# Patient Record
Sex: Female | Born: 1955 | Race: White | Hispanic: No | Marital: Married
Health system: Midwestern US, Community
[De-identification: ages and names within clinical notes are randomized; demographics above are authoritative.]

## PROBLEM LIST (undated history)

## (undated) DIAGNOSIS — Z13220 Encounter for screening for lipoid disorders: Secondary | ICD-10-CM

## (undated) DIAGNOSIS — R194 Change in bowel habit: Secondary | ICD-10-CM

## (undated) HISTORY — DX: Encounter for screening for lipoid disorders: Z13.220

---

## 2000-09-28 NOTE — ED Provider Notes (Signed)
[facility]                      EMERGENCY DEPARTMENT TREATMENT REPORT   NAME:  Sandra Moss, Sandra Moss   MR #:  [id number]   BILLING #: [id number]        DOS: 09/28/2000  TIME: 3:03 P   cc:   Primary Physician:  Zonia Kief, M.D.   CHIEF COMPLAINT:   Low back pain.   HISTORY OF PRESENT ILLNESS:  The patient is a 45 year old white female who   presents to the Emergency Department with her husband.  The patient states   that this past Wednesday, 09/26/2000, she was lifting a heavy PA component   while at work and twisted and injured her low back.  The patient states   that she immediately had some pain in the right low back.  The patient   stated that the pain had worsened that evening and by the following   morning, she had increased pain with pain radiating down the lateral   posterior aspect of the right leg just past the right knee on the lateral   calf.  The patient denies any bowel or bladder complaints and denies any   paresthesia or weakness of the lower extremities.  The patient was seen   yesterday at [facility] in the [place] where the patient   lives.  She was diagnosed with "herniated disc."  She was prescribed   Vicodin ES and prednisone.  The patient was concerned about this and stated   she was concerned because they did not do any diagnostic tests and came to   this Emergency Department for additional evaluation and essentially a   second opinion.  The patient denied any new symptoms other than worsening   of her symptoms.   REVIEW OF SYSTEMS:   CONSTITUTIONAL:  No fever, chills, weight loss.   RESPIRATORY:  No cough, shortness of breath, or wheezing.   CARDIOVASCULAR:  No chest pain, chest pressure, or palpitations.   GASTROINTESTINAL:  No vomiting, diarrhea, or abdominal pain. Denies any   stool incontinence.   GENITOURINARY:  No dysuria, frequency, or urgency.  Denies any urinary   incontinence. MUSCULOSKELETAL:  Right lower back pain radiating to right    leg as above.   NEUROLOGICAL:   Denies any focal neurological complaints.  Denies any   paresthesia or weakness of the extremities.   Denies complaints in any other system.   PAST MEDICAL HISTORY:   No chronic medical ailments. Left main was   09/12/2000.   MEDICATIONS:  Vicodin and prednisone.   ALLERGIES:    None.   SOCIAL HISTORY:  The patient denies any tobacco, ETOH, or illicit drug use.   The patient is married.   PHYSICAL EXAMINATION:   GENERAL:   The patient is a 45 year old white female, well-developed,   well-nourished, conscious, alert and oriented.  No respiratory distress.   VITAL SIGNS:  Blood pressure 103/74, pulse 70, respirations 20, temperature   95.  The patient's pain is a 10/10.   SKIN:  Warm and dry.  No lesions.   HEENT:  Normocephalic, atraumatic, PERRL, EOMI.  Oropharynx clear. Mucous   membranes moist.  Sclerae is anicteric.   NECK:  Supple, full range of motion, nontender.   LUNGS:  Clear and equal breath sounds.  No wheezes or rhonchi.   CARDIOVASCULAR:  Regular rate and rhythm without significant murmurs, rubs,   or gallops.  ABDOMEN:  Soft, nontender, normal bowel sounds.  No organomegaly, mass,   rebound, or guarding.   BACK:  The patient is diffusely tender to palpation over the lower   paralumbar sacral spinal musculature.  Nontender to palpation and no bony   tenderness over the thoracic or lumbosacral spine itself.  The patient has   negative straight leg raise bilateral lower extremities.  The patient   however does have increased discomfort in her low back with movement of the   lower extremities.   EXTREMITIES:  Full range of motion, normal strength of all extremities.   Deep tendon reflexes are equal lower extremities bilaterally.   COURSE IN THE EMERGENCY DEPARTMENT:  The patient was seen and examined by   myself and Dr. Damien Fusi.  The patient was given Demerol 75 mg and   Vistaril 50 mg IM for pain while in the Emergency Department. The patient's    condition remained stable throughout stay in the Emergency Department and   she felt improved after receiving the medications.   FINAL DIAGNOSIS:   1.  Acute low back strain.   DISPOSITION:  The patient is to be discharged home in stable condition with   low back strain Aftercare instruction sheet.  The patient was advised to   use ice to the low back, continue her prescribed medications, follow up   with Dr. Sabino Donovan who is on call for Orthopedics.  Also, the patient was   given prescription for Flexeril 10 mg and Lodine 300 mg.  The patient was   advised to return to work on 10/03/2000 with limited duty to involve no   bending of the back, no bending, pushing, pulling, or carrying any weight   until cleared by an orthopedist.   Electronically Signed By:   Damien Fusi, M.D. 10/16/2000 02:05   ____________________________   Damien Fusi, M.D.   zga/zga  D:  09/28/2000, 10/11/2000  T:  10/01/2000  7:41 A   [id number]   [name], PA

## 2015-10-22 ENCOUNTER — Inpatient Hospital Stay: Payer: PRIVATE HEALTH INSURANCE

## 2015-10-22 MED ORDER — PROPOFOL 10 MG/ML IV EMUL
10 mg/mL | INTRAVENOUS | Status: DC | PRN
Start: 2015-10-22 — End: 2015-10-22
  Administered 2015-10-22 (×4): via INTRAVENOUS

## 2015-10-22 MED ORDER — LIDOCAINE (PF) 20 MG/ML (2 %) IJ SOLN
20 mg/mL (2 %) | INTRAMUSCULAR | Status: DC | PRN
Start: 2015-10-22 — End: 2015-10-22
  Administered 2015-10-22: 15:00:00 via INTRAVENOUS

## 2015-10-22 MED ORDER — PROPOFOL 10 MG/ML IV EMUL
10 mg/mL | INTRAVENOUS | Status: AC
Start: 2015-10-22 — End: ?

## 2015-10-22 MED ORDER — LACTATED RINGERS IV
INTRAVENOUS | Status: DC
Start: 2015-10-22 — End: 2015-10-22
  Administered 2015-10-22: 14:00:00 via INTRAVENOUS

## 2015-10-22 MED ORDER — INSULIN LISPRO 100 UNIT/ML INJECTION
100 unit/mL | Freq: Once | SUBCUTANEOUS | Status: DC
Start: 2015-10-22 — End: 2015-10-22

## 2015-10-22 MED FILL — PROPOFOL 10 MG/ML IV EMUL: 10 mg/mL | INTRAVENOUS | Qty: 100

## 2015-10-22 MED FILL — LACTATED RINGERS IV: INTRAVENOUS | Qty: 1000

## 2015-10-22 NOTE — Procedures (Signed)
Procedures  by Willaim Rayasivera, Babette Stum J, MD at 10/22/15 1118                Author: Willaim Rayasivera, Renate Danh J, MD  Service: Gastroenterology  Author Type: Physician       Filed: 10/22/15 1120  Date of Service: 10/22/15 1118  Status: Signed          Editor: Willaim Rayasivera, Arnie Maiolo J, MD (Physician)            Procedures        1. COLONOSCOPY [[id number] (Custom)]                                         Colonoscopy Report          Patient: Sandra Moss  MRN: [id number]   SSN: [id number]          Date of Birth: 1956-03-05   Age: 60 y.o.   Sex: female         Date of Procedure: 10/22/2015      IMPRESSION:   1. Transverse colon polyp, 5 mm, status post forceps removed   2. Normal terminal ileum   3. Rectal polyp, 3 mm, status post forceps removed   4. Internal hemorrhoids      RECOMMENDATIONS:   1.  Resume regular diet, Recommend high fiber.   2.  Will contact with polyp results in 2 weeks. These results will determine timing to next screening. Patient will be instructed to contact our office if they have not received the results by three weeks.      Indication:  Screening colonoscopy, LUQ abdominal pain   Procedure Performed: Colonoscopy polypectomy (cold biopsy)   Endoscopist: Willaim Rayasene J Mouhamad Teed, MD   Assistant: Endoscopy Technician-1: Hoover Brownsrystal A Joyner   Endoscopy RN-1: Malva LimesPamela A Bennett, RN   ASA: ASA 2 - Patient with mild systemic disease with no functional limitations   Mallampati Score: I (soft palate, uvula, fauces, tonsillar pillars visible)   Anesthesia: MAC anesthesia Propofol   Endoscope:          [x]   CF H 190AL    []   PCF H190AL    []   GIF H 190       Extent of Examination:cecum,  which was identified by the ileocecal valve and appendiceal orifice, terminal ileum   Quality of Preparation:            [x]   Excellent    []   Very Good    []  Fair but adequate    []  Fair, inadequate    []   Poor          Technique: The procedure was discussed with the patient including risks, benefits, alternatives including risks of IV sedation, bleeding,  perforation and missed polyp.  A safety timeout was performed.   The patient was given incremental doses of intravenous sedation to achieve moderate sedation.  The patients vital signs were monitored at all times including heart rate, rhythm, blood pressure and oxygen saturation. The patient was placed in left lateral  position.    When adequate sedation was achieved a perianal inspection and a digital rectal exam were performed. Video colonoscope was introduced into the rectum and advanced under direct vision up to the cecum,  which was identified by the ileocecal valve and appendiceal orifice. The cecum was identified by IC valve, appendiceal orifice and crows foot. With adequate insufflation and  maneuvering of the withdrawing  scope, the colonic mucosa was visualized carefully. Retroflexion was performed in the rectum and the distal rectum visualized.  The patient tolerated the procedure very well and was transferred to recovery area.       Findings:   1. Transverse colon polyp, 5 mm, status post forceps removed   2. Normal terminal ileum   3. Rectal polyp, 3 mm, status post forceps removed   4. Internal hemorrhoids         UJW:[id number]   Specimen:            ID  Type  Source  Tests  Collected by  Time  Destination     1 : Bx polyp  Preservative  Colon, Transverse    Willaim Rayas, MD  10/22/2015 1059  Pathology     2 : BX polyp  Preservative  Rectum    Willaim Rayas, MD  10/22/2015 1106  Pathology           Willaim Rayas, MD   October 22, 2015   11:19 AM

## 2015-10-22 NOTE — Anesthesia Pre-Procedure Evaluation (Signed)
Anesthetic History   No history of anesthetic complications            Review of Systems / Medical History  Patient summary reviewed and pertinent labs reviewed    Pulmonary  Within defined limits                 Neuro/Psych   Within defined limits           Cardiovascular  Within defined limits                Exercise tolerance: >4 METS     GI/Hepatic/Renal  Within defined limits              Endo/Other  Within defined limits           Other Findings   Comments:   Risk Factors for Postoperative nausea/vomiting:       History of postoperative nausea/vomiting?  NO       Female?  YES       Motion sickness?  NO       Intended opioid administration for postoperative analgesia?  NO      Smoking Abstinence  Current Smoker?   NO  Elective Surgery? YES  Seen preoperatively by anesthesiologist or proxy prior to day of surgery?  YES  Pt abstained from smoking 24 hours prior to anesthesia? N/A           Physical Exam    Airway  Mallampati: II  TM Distance: 4 - 6 cm  Neck ROM: normal range of motion   Mouth opening: Normal     Cardiovascular    Rhythm: regular  Rate: normal         Dental  No notable dental hx       Pulmonary  Breath sounds clear to auscultation               Abdominal  GI exam deferred       Other Findings            Anesthetic Plan    ASA: 2  Anesthesia type: MAC            Anesthetic plan and risks discussed with: Patient

## 2015-10-22 NOTE — Procedures (Signed)
Colonoscopy Report    Patient: Sandra Moss MRN: [id number]  SSN: [id number]    Date of Birth: [birth year]  Age: 60 y.o.  Sex: female      Date of Procedure: 10/22/2015    IMPRESSION:  1. Transverse colon polyp, 5 mm, status post forceps removed  2. Normal terminal ileum  3. Rectal polyp, 3 mm, status post forceps removed  4. Internal hemorrhoids    RECOMMENDATIONS:  1. Resume regular diet, Recommend high fiber.  2. Will contact with polyp results in 2 weeks. These results will determine timing to next screening. Patient will be instructed to contact our office if they have not received the results by three weeks.    Indication:  Screening colonoscopy, LUQ abdominal pain  Procedure Performed: Colonoscopy polypectomy (cold biopsy)  Endoscopist: Willaim Rayasene J Eldred Sooy, MD  Assistant: Endoscopy Technician-1: Hoover Brownsrystal A Joyner  Endoscopy RN-1: Malva LimesPamela A Bennett, RN  ASA: ASA 2 - Patient with mild systemic disease with no functional limitations  Mallampati Score: I (soft palate, uvula, fauces, tonsillar pillars visible)  Anesthesia: MAC anesthesia Propofol  Endoscope:       CF H 190AL     PCF H190AL     GIF H 190    Extent of Examination:cecum, which was identified by the ileocecal valve and appendiceal orifice, terminal ileum  Quality of Preparation:       Excellent     Very Good    Fair but adequate    Fair, inadequate     Poor      Technique: The procedure was discussed with the patient including risks, benefits, alternatives including risks of IV sedation, bleeding, perforation and missed polyp.  A safety timeout was performed.  The patient was given incremental doses of intravenous sedation to achieve moderate sedation.  The patients vital signs were monitored at all times including heart rate, rhythm, blood pressure and oxygen saturation. The patient was placed in left lateral position.   When adequate sedation was achieved a perianal inspection and a digital  rectal exam were performed. Video colonoscope was introduced into the rectum and advanced under direct vision up to the cecum, which was identified by the ileocecal valve and appendiceal orifice. The cecum was identified by IC valve, appendiceal orifice and crows foot. With adequate insufflation and maneuvering of the withdrawing scope, the colonic mucosa was visualized carefully. Retroflexion was performed in the rectum and the distal rectum visualized.  The patient tolerated the procedure very well and was transferred to recovery area.     Findings:  1. Transverse colon polyp, 5 mm, status post forceps removed  2. Normal terminal ileum  3. Rectal polyp, 3 mm, status post forceps removed  4. Internal hemorrhoids      NWG:NFAOZHYEBL:Minimal  Specimen:   ID Type Source Tests Collected by Time Destination   1 : Bx polyp Preservative Colon, Transverse  Willaim Rayasene J Eddie Koc, MD 10/22/2015 1059 Pathology   2 : BX polyp Preservative Rectum  Willaim Rayasene J Amareon Phung, MD 10/22/2015 1106 Pathology       Willaim Rayasene J Solange Emry, MD  October 22, 2015  11:19 AM

## 2015-10-22 NOTE — Other (Signed)
Dr. Rivera at bedside

## 2015-10-22 NOTE — H&P (Signed)
Gastroenterology Consult     Referring Physician: Other    Consult Date: 10/22/2015     Subjective:     Chief Complaint: Change in bowel habits    History of Present Illness: Sandra Moss is a 60 y.o. female who is seen in consultation for change in bowel habits. Patient was evaluated and examined in the office. Please see scanned note. No interval changes in medical status or examination.     History reviewed. No pertinent past medical history.  Past Surgical History:   Procedure Laterality Date   ??? HX OOPHORECTOMY  1997      History reviewed. No pertinent family history.  Social History   Substance Use Topics   ??? Smoking status: Never Smoker   ??? Smokeless tobacco: Never Used   ??? Alcohol use No      No Known Allergies  Current Facility-Administered Medications   Medication Dose Route Frequency   ??? lactated Ringers infusion  50 mL/hr IntraVENous CONTINUOUS   ??? insulin lispro (HUMALOG) injection   SubCUTAneous ONCE        Review of Systems:  A detailed 10 organ review of systems is obtained with pertinent positives as listed in the History of Present Illness and Past Medical History. All others are negative.    Objective:     Physical Exam:  Visit Vitals   ??? BP 124/78   ??? Pulse 70   ??? Temp 97.8 ??F (36.6 ??C)   ??? Resp 16   ??? Ht 5' 4.5" (1.638 m)   ??? Wt 56.7 kg (125 lb)   ??? SpO2 100%   ??? BMI 21.12 kg/m2        HEENT: Sclerae anicteric.  Cardiovascular: Regular rate and rhythm.  Respiratory:  Comfortable breathing with no accessory muscle use.   GI:  Abdomen nondistended, soft, and nontender.    Neurological:  Gross memory appears intact.  Patient is alert and oriented.  Assessment/Plan:     Colonoscopy as planned

## 2015-10-22 NOTE — Anesthesia Post-Procedure Evaluation (Signed)
Post-Anesthesia Evaluation and Assessment    Patient: Sandra Moss MRN: [id number]  SSN: [id number]    Date of Birth: 1956-01-04  Age: 60 y.o.  Sex: female       Cardiovascular Function/Vital Signs  Visit Vitals   ??? BP 101/71 (BP 1 Location: Left arm, BP Patient Position: At rest)   ??? Pulse 71   ??? Temp 36.6 ??C (97.8 ??F)   ??? Resp 18   ??? Ht 5' 4.5" (1.638 m)   ??? Wt 56.7 kg (125 lb)   ??? SpO2 100%   ??? BMI 21.12 kg/m2       Patient is status post MAC anesthesia for Procedure(s):  COLONOSCOPY.    Nausea/Vomiting: None    Postoperative hydration reviewed and adequate.    Pain:  Pain Scale 1: Numeric (0 - 10) (10/22/15 1124)  Pain Intensity 1: 0 (10/22/15 1124)   Managed    Neurological Status:       At baseline    Mental Status and Level of Consciousness: Alert and oriented     Pulmonary Status:   O2 Device: Room air (10/22/15 1113)   Adequate oxygenation and airway patent    Complications related to anesthesia: None    Post-anesthesia assessment completed. No concerns    Signed By: Abelina Bacheloravid L Yaneli Keithley, MD     October 22, 2015

## 2015-10-23 MED FILL — LIDOCAINE (PF) 20 MG/ML (2 %) IJ SOLN: 20 mg/mL (2 %) | INTRAMUSCULAR | Qty: 4

## 2015-10-23 MED FILL — PROPOFOL 10 MG/ML IV EMUL: 10 mg/mL | INTRAVENOUS | Qty: 120

## 2018-05-08 ENCOUNTER — Ambulatory Visit
Admission: RE | Admit: 2018-05-08 | Discharge: 2018-05-08 | Disposition: A | Discharge status: Home / Self Care | Payer: No Typology Code available for payment source | Source: Ambulatory Visit | Attending: Family Medicine | Admitting: Family Medicine

## 2018-05-08 ENCOUNTER — Other Ambulatory Visit: Payer: Self-pay

## 2018-05-08 ENCOUNTER — Ambulatory Visit (INDEPENDENT_AMBULATORY_CARE_PROVIDER_SITE_OTHER): Payer: PRIVATE HEALTH INSURANCE | Admitting: Family Medicine

## 2018-05-08 ENCOUNTER — Encounter: Payer: Self-pay | Admitting: Family Medicine

## 2018-05-08 DIAGNOSIS — Z114 Encounter for screening for human immunodeficiency virus [HIV]: Secondary | ICD-10-CM

## 2018-05-08 DIAGNOSIS — R05 Cough: Secondary | ICD-10-CM

## 2018-05-08 DIAGNOSIS — Z1159 Encounter for screening for other viral diseases: Secondary | ICD-10-CM | POA: Insufficient documentation

## 2018-05-08 DIAGNOSIS — Z13220 Encounter for screening for lipoid disorders: Secondary | ICD-10-CM

## 2018-05-08 DIAGNOSIS — K219 Gastro-esophageal reflux disease without esophagitis: Secondary | ICD-10-CM

## 2018-05-08 DIAGNOSIS — R059 Cough, unspecified: Secondary | ICD-10-CM

## 2018-05-08 HISTORY — DX: Encounter for screening for lipoid disorders: Z13.220

## 2018-05-08 NOTE — Patient Instructions (Signed)
I will call you at [phone] with blood and x ray results. I will not order any mammogram, pap, etc until I get outside records. We will need to pay close attention to your cholesterol given your family history. I will likely call in a prescription for acid lowering medication if the tests are normal

## 2018-05-09 ENCOUNTER — Encounter: Payer: Self-pay | Admitting: Family Medicine

## 2018-05-09 DIAGNOSIS — K219 Gastro-esophageal reflux disease without esophagitis: Secondary | ICD-10-CM | POA: Insufficient documentation

## 2018-05-09 DIAGNOSIS — E785 Hyperlipidemia, unspecified: Secondary | ICD-10-CM | POA: Insufficient documentation

## 2018-05-09 LAB — LIPID PANEL
CHOL/HDL RATIO: 4 ratio (ref 0.0–4.4)
Cholesterol, Total: 245 mg/dL — ABNORMAL HIGH (ref 100–199)
HDL: 61 mg/dL (ref >39)
LDL Calculated: 146 mg/dL — ABNORMAL HIGH (ref 0–99)
Triglycerides: 190 mg/dL — ABNORMAL HIGH (ref 0–149)
VLDL Cholesterol Cal: 38 mg/dL (ref 5–40)

## 2018-05-09 LAB — CBC
Hematocrit: 43.2 % (ref 34.0–46.6)
Hemoglobin: 14.3 g/dL (ref 11.1–15.9)
MCH: 30.8 pg (ref 26.6–33.0)
MCHC: 33.1 g/dL (ref 31.5–35.7)
MCV: 93 fL (ref 79–97)
Platelets: 376 10*3/uL (ref 150–450)
RBC: 4.65 x10E6/uL (ref 3.77–5.28)
RDW: 13 % (ref 11.7–15.4)
WBC: 7.3 10*3/uL (ref 3.4–10.8)

## 2018-05-09 LAB — HEPATITIS C ANTIBODY

## 2018-05-09 LAB — HIV ANTIBODY (ROUTINE TESTING W REFLEX): HIV Screen 4th Generation wRfx: NONREACTIVE

## 2018-05-09 MED ORDER — PANTOPRAZOLE SODIUM 40 MG PO TBEC: 40.0000 mg | DELAYED_RELEASE_TABLET | Freq: Every day | ORAL | 3 refills | Status: AC

## 2018-05-09 NOTE — Assessment & Plan Note (Signed)
Check lipids.  Strong FHx of CAD.  Likely needs statin.

## 2018-05-09 NOTE — Assessment & Plan Note (Signed)
Never checked.  Low risk.  Screen only due to age.

## 2018-05-09 NOTE — Progress Notes (Signed)
New Patient Office Visit  Subjective:  Patient ID: Erica Flores, female    DOB: 06-Jul-1955  Age: 63 y.o. MRN: [id number]  CC:  Chief Complaint  Patient presents with  . New Patient (Initial Visit)    HPI Erica Flores presents for cough.  Patient is the adult daughter of two existing patients.  Her parents encourage her to see me because of cough x 1 year.  [place]  She is unclear how she will use me long term.    Cough began one year ago - she thinks with a cold.  The cough was minimal until four months ago when she had another chest cold.  Now it is chronic and nonproductive.  Does not seem to be decreasing.  Denies fever, DOE, night sweats.  She is a never tobacco user.  No hx of asthma or COPD.  No occupational exposure.  She has been seen by ENT and told likely GERD.  She has some GERD sx but is skeptical of this diagnosis.  Never had a chest xray.  HPDP: Gets paps and mammograms elsewhere.  States that she also gets annual physicals, but no one has done any blood work on her in years.  FHx strongly positive for CAD  Very healthy lifestyle.  Fit with regular aerobic activity and eats healthy diet.  History reviewed. No pertinent past medical history.  History reviewed. No pertinent surgical history.  History reviewed. No pertinent family history.  Social History   Socioeconomic History  . Marital status: Married    Spouse name: Not on file  . Number of children: Not on file  . Years of education: Not on file  . Highest education level: Not on file  Occupational History  . Not on file  Social Needs  . Financial resource strain: Not on file  . Food insecurity:    Worry: Not on file    Inability: Not on file  . Transportation needs:    Medical: Not on file    Non-medical: Not on file  Tobacco Use  . Smoking status: Never Smoker  . Smokeless tobacco: Never Used  Substance and Sexual  Activity  . Alcohol use: Not on file  . Drug use: Not on file  . Sexual activity: Not on file  Lifestyle  . Physical activity:    Days per week: Not on file    Minutes per session: Not on file  . Stress: Not on file  Relationships  . Social connections:    Talks on phone: Not on file    Gets together: Not on file    Attends religious service: Not on file    Active member of club or organization: Not on file    Attends meetings of clubs or organizations: Not on file    Relationship status: Not on file  . Intimate partner violence:    Fear of current or ex partner: Not on file    Emotionally abused: Not on file    Physically abused: Not on file    Forced sexual activity: Not on file  Other Topics Concern  . Not on file  Social History Narrative  . Not on file    ROS Review of Systems Denies CP, SOB, headache, abd pain, worrisome skin lesions or abnormal bleeding per vagina or rectum. Denies change in bowel, bladder, appetite or wt. Objective:   Today's Vitals: BP 102/60  Pulse 73   Temp 97.8 F (36.6 C) (Oral)   Wt 137 lb 3.2 oz (62.2 kg)   SpO2 97%   Physical Exam HEENT WNL Neck supple without nodes. Lungs clear Cardiac RRR without m or g Abd benign Ext no edema or clubbing  Assessment & Plan:   Problem List Items Addressed This Visit    Screening for HIV without presence of risk factors   Relevant Orders   HIV Antibody (routine testing w rflx)   Screening cholesterol level   Relevant Orders   Lipid panel   Encounter for hepatitis C screening test for low risk patient   Relevant Orders   Hepatitis C antibody   Cough   Relevant Orders   DG Chest 2 View (Completed)   CBC      Outpatient Encounter Medications as of 05/08/2018  Medication Sig  . pantoprazole (PROTONIX) 40 MG tablet Take 1 tablet (40 mg total) by mouth daily.   No facility-administered encounter medications on file as of 05/08/2018.     Follow-up: No follow-ups on file.   Erica Manners, MD

## 2018-05-09 NOTE — Assessment & Plan Note (Signed)
Check x 1 - she doubts that she has ever been tested.

## 2018-05-09 NOTE — Assessment & Plan Note (Signed)
Likely has but per patient largely asymptomatic.

## 2018-05-09 NOTE — Assessment & Plan Note (Signed)
Long differential.  Will start with basic labs (CBC and CMP)  And Chest xray.  Assuming all are normal, I think reasonable to give trial of PPI for GERD.

## 2018-05-15 ENCOUNTER — Encounter: Payer: Self-pay | Admitting: Family Medicine

## 2018-05-15 DIAGNOSIS — Z1211 Encounter for screening for malignant neoplasm of colon: Secondary | ICD-10-CM | POA: Insufficient documentation

## 2018-05-15 DIAGNOSIS — Z1239 Encounter for other screening for malignant neoplasm of breast: Secondary | ICD-10-CM

## 2018-05-15 DIAGNOSIS — Z124 Encounter for screening for malignant neoplasm of cervix: Secondary | ICD-10-CM | POA: Insufficient documentation

## 2018-05-15 NOTE — Progress Notes (Signed)
Received outside records and will scan into chart.

## 2018-06-19 ENCOUNTER — Ambulatory Visit (INDEPENDENT_AMBULATORY_CARE_PROVIDER_SITE_OTHER): Payer: No Typology Code available for payment source | Admitting: Family Medicine

## 2018-06-19 ENCOUNTER — Other Ambulatory Visit: Payer: Self-pay

## 2018-06-19 ENCOUNTER — Encounter: Payer: Self-pay | Admitting: Family Medicine

## 2018-06-19 DIAGNOSIS — J189 Pneumonia, unspecified organism: Secondary | ICD-10-CM | POA: Diagnosis not present

## 2018-06-19 MED ORDER — AZITHROMYCIN 250 MG PO TABS: ORAL_TABLET | ORAL | 0 refills | Status: AC

## 2018-06-19 NOTE — Patient Instructions (Signed)
Maybe the antibiotics will help,maybe not.   You may just be unlucky with back to back flu like illnesses. Make sure your over the counter cough medicine contains DM=dextromethorphan

## 2018-06-20 ENCOUNTER — Encounter: Payer: Self-pay | Admitting: Family Medicine

## 2018-06-20 NOTE — Progress Notes (Signed)
Established Patient Office Visit  Subjective:  Patient ID: Erica Flores, female    DOB: September 23, 1955  Age: 63 y.o. MRN: [id number]  CC:  Chief Complaint  Patient presents with  . Cough    HPI Erica Flores presents for respiratory illness of three weeks duration.  Patient had chronic cough that nicely disappeared with PPI so I can safely assume the diagnosis of GERD.  This problem is different 3 weeks ago developed a respiratory illness with low grade fever, cough and body aches.  She was showing slow improvement until one week ago when she worsened with low grade fever again, worsening cough and mild throat and neck discomfort.  Does not have SOB.  Does have some chest soreness that she attributes to coughing.  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    Past Medical History:  Diagnosis Date  . Screening cholesterol level 05/08/2018    History reviewed. No pertinent surgical history.  History reviewed. No pertinent family history.  Social History   Socioeconomic History  . Marital status: Married    Spouse name: Not on file  . Number of children: Not on file  . Years of education: Not on file  . Highest education level: Not on file  Occupational History  . Not on file  Social Needs  . Financial resource strain: Not on file  . Food insecurity:    Worry: Not on file    Inability: Not on file  . Transportation needs:    Medical: Not on file    Non-medical: Not on file  Tobacco Use  . Smoking status: Never Smoker  . Smokeless tobacco: Never Used  Substance and Sexual Activity  . Alcohol use: Not on file  . Drug use: Not on file  . Sexual activity: Not on file  Lifestyle  . Physical activity:    Days per week: Not on file    Minutes per session: Not on file  . Stress: Not on file  Relationships  . Social connections:    Talks on phone: Not on file    Gets together: Not on file    Attends religious  service: Not on file    Active member of club or organization: Not on file    Attends meetings of clubs or organizations: Not on file    Relationship status: Not on file  . Intimate partner violence:    Fear of current or ex partner: Not on file    Emotionally abused: Not on file    Physically abused: Not on file    Forced sexual activity: Not on file  Other Topics Concern  . Not on file  Social History Narrative  . Not on file    Outpatient Medications Prior to Visit  Medication Sig Dispense Refill  . pantoprazole (PROTONIX) 40 MG tablet Take 1 tablet (40 mg total) by mouth daily. 30 tablet 3   No facility-administered medications prior to visit.     No Known Allergies  ROS Review of Systems    Objective:    Physical Exam  BP 106/64   Pulse 80   Temp 97.9 F (36.6 C) (Oral)   Ht '5\' 5"'  (1.651 m)   Wt 136 lb 9.6 oz (62 kg)   SpO2 97%   BMI 22.73 kg/m  Wt Readings from Last 3 Encounters:  06/19/18 136 lb 9.6 oz (62 kg)  05/08/18 137 lb 3.2 oz (62.2 kg)  Non toxic: VS noted. TMs normal Throat, mild injection Neck, no sig nodes. Lungs bibasilar rales with deep inspiration.  Clear with repeated deep inspiration.  No wheeze or rhonchi. Cardiac RRR without m or g  Health Maintenance Due  Topic Date Due  . TETANUS/TDAP  03/03/1977  . PAP SMEAR-Modifier  03/03/1988  . MAMMOGRAM  03/03/2008  . COLONOSCOPY  03/03/2008    There are no preventive care reminders to display for this patient.  No results found for: TSH Lab Results  Component Value Date   WBC 7.3 05/08/2018   HGB 14.3 05/08/2018   HCT 43.2 05/08/2018   MCV 93 05/08/2018   PLT 376 05/08/2018   No results found for: NA, K, CHLORIDE, CO2, GLUCOSE, BUN, CREATININE, BILITOT, ALKPHOS, AST, ALT, PROT, ALBUMIN, CALCIUM, ANIONGAP, EGFR, GFR Lab Results  Component Value Date   CHOL 245 (H) 05/08/2018   Lab Results  Component Value Date   HDL 61 05/08/2018   Lab Results  Component Value Date    LDLCALC 146 (H) 05/08/2018   Lab Results  Component Value Date   TRIG 190 (H) 05/08/2018   Lab Results  Component Value Date   CHOLHDL 4.0 05/08/2018   No results found for: HGBA1C    Assessment & Plan:   Problem List Items Addressed This Visit    Pneumonia   Relevant Medications   azithromycin (ZITHROMAX) 250 MG tablet      Meds ordered this encounter  Medications  . azithromycin (ZITHROMAX) 250 MG tablet    Sig: 2 tabs today then 1 tab daily until gone    Dispense:  6 tablet    Refill:  0    Follow-up: No follow-ups on file.    Zenia Resides, MD

## 2018-06-20 NOTE — Assessment & Plan Note (Signed)
Given rales, I feel justified in calling pneumonia.  My best clinical diagnosis is two back to back viral infections.  Still after 3 weeks of illness and crackles on exam, will try empiric antibiotics.  Patient declined CXR.

## 2018-06-21 ENCOUNTER — Encounter: Payer: Self-pay | Admitting: Family Medicine

## 2019-01-21 ENCOUNTER — Other Ambulatory Visit: Payer: Self-pay

## 2019-01-21 DIAGNOSIS — Z20822 Contact with and (suspected) exposure to covid-19: Secondary | ICD-10-CM

## 2019-01-22 LAB — NOVEL CORONAVIRUS, NAA: SARS-CoV-2, NAA: NOT DETECTED

## 2019-09-06 IMAGING — CR DG CHEST 2V
2 series · 2 of 2 positions shown · non-contrast
Comparison: None.

CLINICAL DATA: 62-year-old female with cough for 6 months.

EXAM:
CHEST - 2 VIEW

[w chest pa]
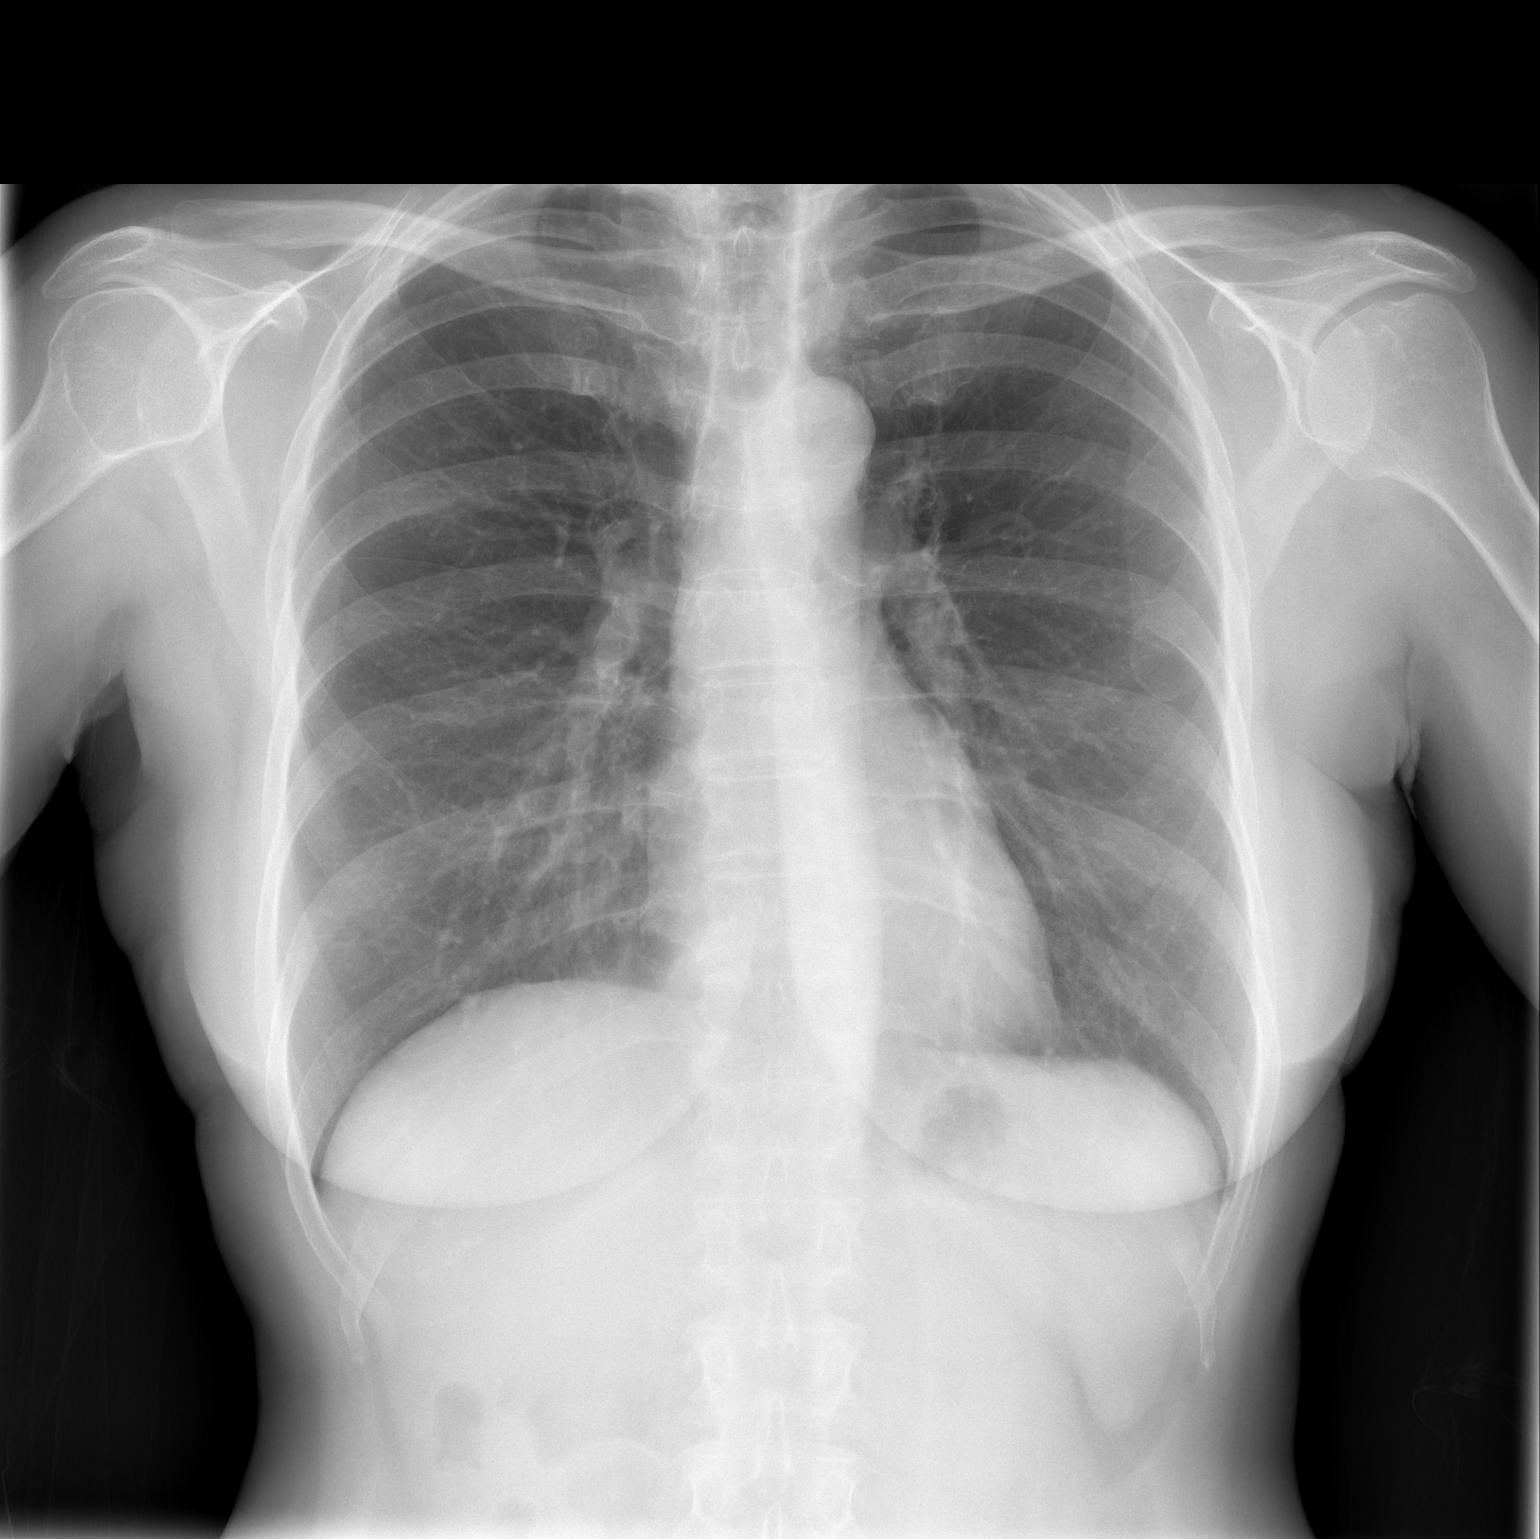

[w chest lat]
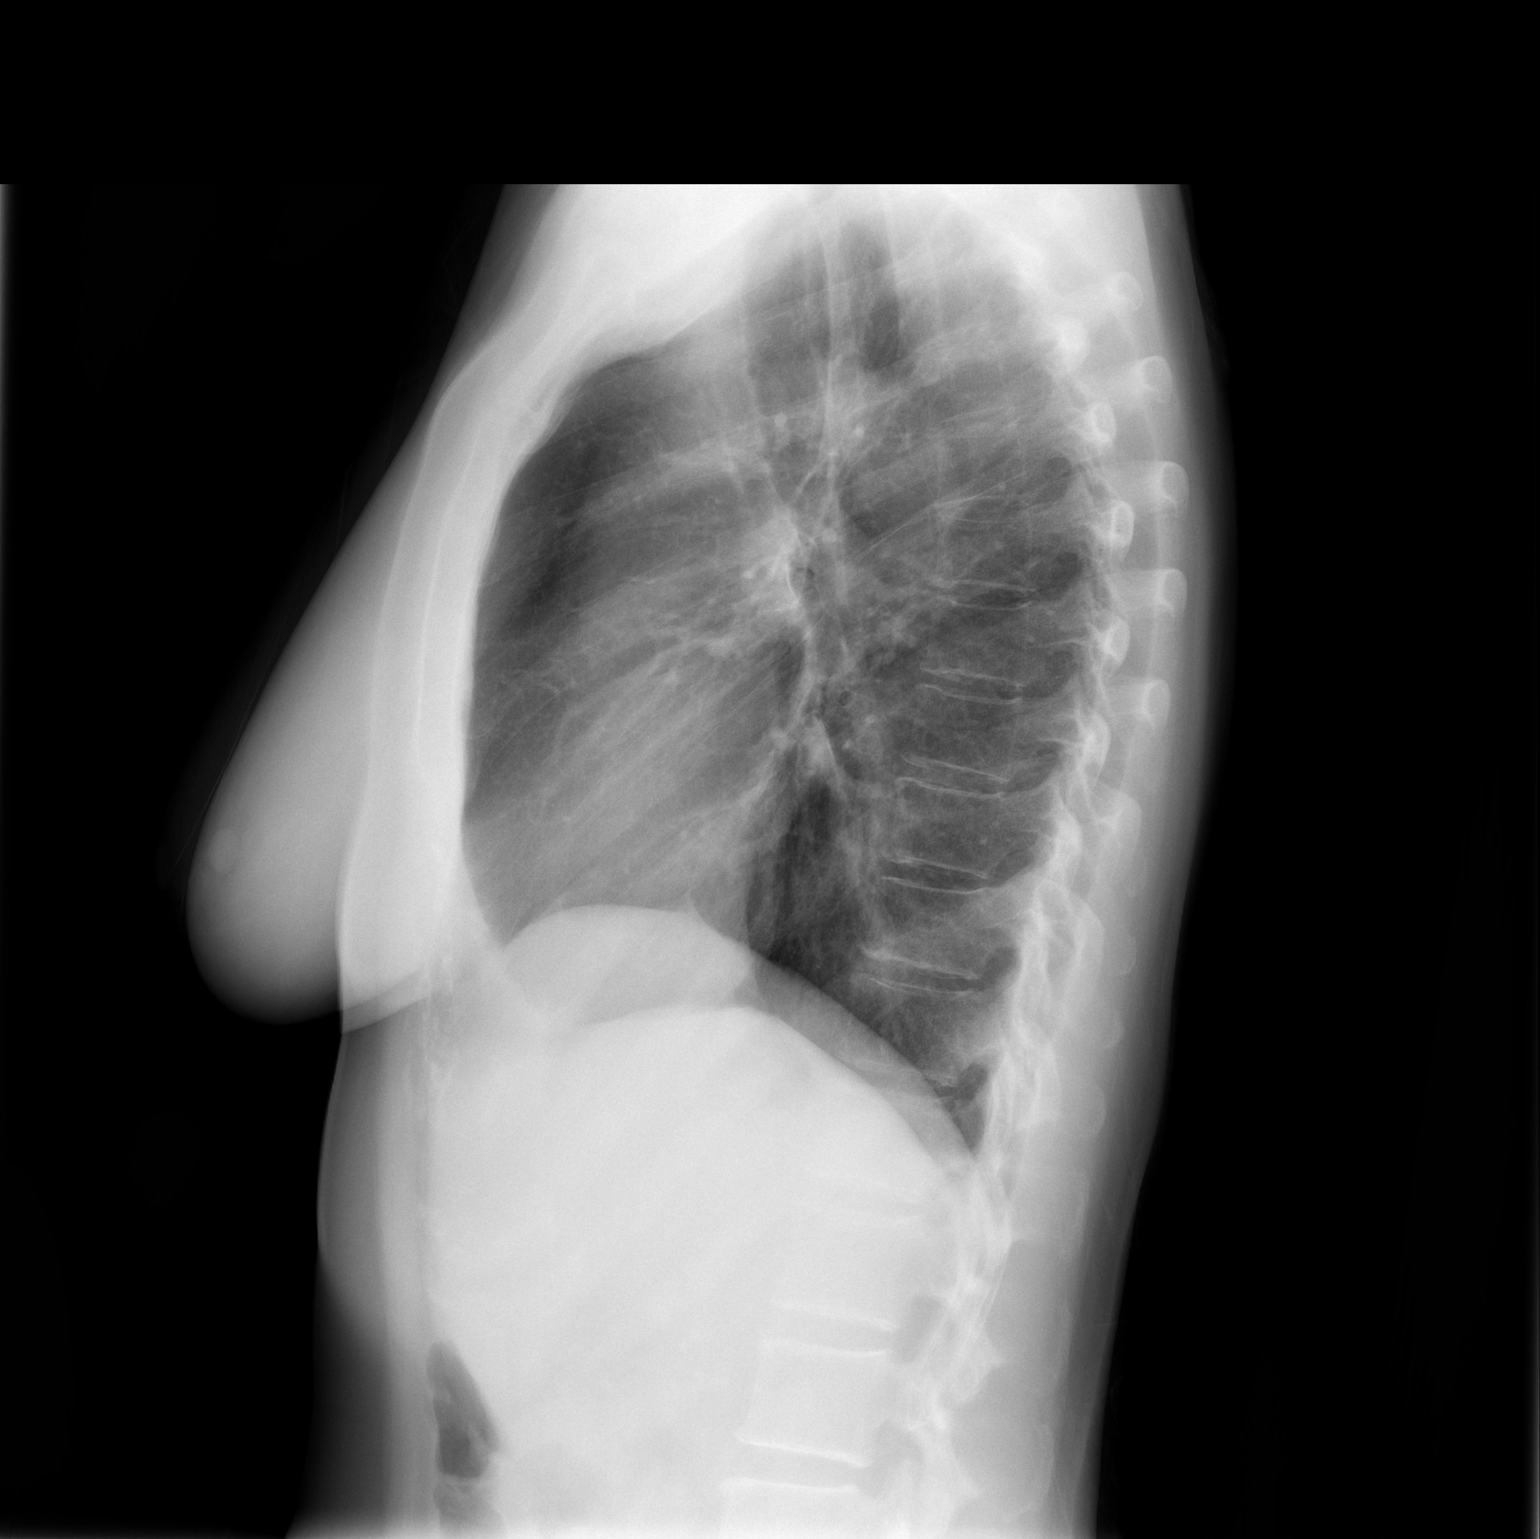

[2 of 2 positions shown; findings below may reference images not displayed]

FINDINGS: Lung volumes and mediastinal contours are within normal limits.
Visualized tracheal air column is within normal limits. There is
mild apical pulmonary scarring. No pneumothorax, pulmonary edema,
pleural effusion or confluent pulmonary opacity.

Negative visible osseous structures. Negative visible bowel gas
pattern.
IMPRESSION: No acute cardiopulmonary abnormality.
